# Patient Record
Sex: Male | Born: 1980 | Race: Black or African American | Hispanic: No | Marital: Married | Smoking: Current every day smoker
Health system: Southern US, Community
[De-identification: ages and names within clinical notes are randomized; demographics above are authoritative.]

## PROBLEM LIST (undated history)

## (undated) DIAGNOSIS — K219 Gastro-esophageal reflux disease without esophagitis: Secondary | ICD-10-CM

## (undated) HISTORY — PX: BACK SURGERY: SHX140

---

## 2014-10-01 ENCOUNTER — Other Ambulatory Visit: Payer: Self-pay | Admitting: Neurosurgery

## 2014-10-09 ENCOUNTER — Encounter (HOSPITAL_COMMUNITY)
Admission: RE | Admit: 2014-10-09 | Discharge: 2014-10-09 | Disposition: A | Discharge status: Home / Self Care | Payer: Worker's Compensation | Source: Ambulatory Visit | Attending: Neurosurgery | Admitting: Neurosurgery

## 2014-10-09 ENCOUNTER — Encounter (HOSPITAL_COMMUNITY): Payer: Self-pay

## 2014-10-09 DIAGNOSIS — Z01812 Encounter for preprocedural laboratory examination: Secondary | ICD-10-CM | POA: Diagnosis not present

## 2014-10-09 DIAGNOSIS — M5126 Other intervertebral disc displacement, lumbar region: Secondary | ICD-10-CM | POA: Insufficient documentation

## 2014-10-09 DIAGNOSIS — Z0183 Encounter for blood typing: Secondary | ICD-10-CM | POA: Insufficient documentation

## 2014-10-09 HISTORY — DX: Gastro-esophageal reflux disease without esophagitis: K21.9

## 2014-10-09 LAB — CBC
HEMATOCRIT: 46.1 % (ref 39.0–52.0)
Hemoglobin: 15.8 g/dL (ref 13.0–17.0)
MCH: 32 pg (ref 26.0–34.0)
MCHC: 34.3 g/dL (ref 30.0–36.0)
MCV: 93.3 fL (ref 78.0–100.0)
PLATELETS: 343 10*3/uL (ref 150–400)
RBC: 4.94 MIL/uL (ref 4.22–5.81)
RDW: 14 % (ref 11.5–15.5)
WBC: 7.6 10*3/uL (ref 4.0–10.5)

## 2014-10-09 LAB — SURGICAL PCR SCREEN
MRSA, PCR: NEGATIVE
Staphylococcus aureus: NEGATIVE

## 2014-10-09 LAB — TYPE AND SCREEN
ABO/RH(D): B POS
Antibody Screen: NEGATIVE

## 2014-10-09 LAB — ABO/RH: ABO/RH(D): B POS

## 2014-10-12 MED ORDER — DEXAMETHASONE SODIUM PHOSPHATE 10 MG/ML IJ SOLN
10.0000 mg | INTRAMUSCULAR | Status: AC
Start: 2014-10-13 — End: 2014-10-13
  Administered 2014-10-13: 10 mg via INTRAVENOUS
  Filled 2014-10-12: qty 1

## 2014-10-13 ENCOUNTER — Inpatient Hospital Stay (HOSPITAL_COMMUNITY): Payer: Worker's Compensation | Admitting: Anesthesiology

## 2014-10-13 ENCOUNTER — Encounter (HOSPITAL_COMMUNITY)
Admission: RE | Disposition: A | Discharge status: Home / Self Care | Payer: Self-pay | Source: Ambulatory Visit | Attending: Neurosurgery

## 2014-10-13 ENCOUNTER — Inpatient Hospital Stay (HOSPITAL_COMMUNITY): Payer: Worker's Compensation

## 2014-10-13 ENCOUNTER — Inpatient Hospital Stay (HOSPITAL_COMMUNITY)
Admission: RE | Admit: 2014-10-13 | Discharge: 2014-10-14 | DRG: 460 | Disposition: A | Discharge status: Home / Self Care | Payer: Worker's Compensation | Source: Ambulatory Visit | Attending: Neurosurgery | Admitting: Neurosurgery

## 2014-10-13 DIAGNOSIS — M5126 Other intervertebral disc displacement, lumbar region: Secondary | ICD-10-CM | POA: Diagnosis present

## 2014-10-13 DIAGNOSIS — M549 Dorsalgia, unspecified: Secondary | ICD-10-CM | POA: Diagnosis present

## 2014-10-13 DIAGNOSIS — Z79899 Other long term (current) drug therapy: Secondary | ICD-10-CM

## 2014-10-13 DIAGNOSIS — F17210 Nicotine dependence, cigarettes, uncomplicated: Secondary | ICD-10-CM | POA: Diagnosis present

## 2014-10-13 DIAGNOSIS — M4326 Fusion of spine, lumbar region: Secondary | ICD-10-CM

## 2014-10-13 DIAGNOSIS — M5136 Other intervertebral disc degeneration, lumbar region: Secondary | ICD-10-CM | POA: Diagnosis present

## 2014-10-13 DIAGNOSIS — M51369 Other intervertebral disc degeneration, lumbar region without mention of lumbar back pain or lower extremity pain: Secondary | ICD-10-CM | POA: Diagnosis present

## 2014-10-13 HISTORY — PX: MAXIMUM ACCESS (MAS)POSTERIOR LUMBAR INTERBODY FUSION (PLIF) 1 LEVEL: SHX6368

## 2014-10-13 SURGERY — FOR MAXIMUM ACCESS (MAS) POSTERIOR LUMBAR INTERBODY FUSION (PLIF) 1 LEVEL
Anesthesia: General | Site: Back

## 2014-10-13 MED ORDER — VANCOMYCIN HCL 1000 MG IV SOLR
INTRAVENOUS | Status: AC
  Filled 2014-10-13: qty 1000

## 2014-10-13 MED ORDER — HYDROMORPHONE HCL 1 MG/ML IJ SOLN: 0.2500 mg | INTRAMUSCULAR | Status: DC | PRN

## 2014-10-13 MED ORDER — ACETAMINOPHEN 325 MG PO TABS: 650.0000 mg | ORAL_TABLET | ORAL | Status: DC | PRN

## 2014-10-13 MED ORDER — CEFAZOLIN SODIUM-DEXTROSE 2-3 GM-% IV SOLR
2.0000 g | INTRAVENOUS | Status: AC
  Administered 2014-10-13: 2 g via INTRAVENOUS

## 2014-10-13 MED ORDER — ARTIFICIAL TEARS OP OINT
TOPICAL_OINTMENT | OPHTHALMIC | Status: DC | PRN
Start: 2014-10-13 — End: 2014-10-13
  Administered 2014-10-13: 1 via OPHTHALMIC

## 2014-10-13 MED ORDER — KETOROLAC TROMETHAMINE 30 MG/ML IJ SOLN
INTRAMUSCULAR | Status: AC
  Administered 2014-10-13: 30 mg
  Filled 2014-10-13: qty 1

## 2014-10-13 MED ORDER — LIDOCAINE HCL 4 % MT SOLN
OROMUCOSAL | Status: DC | PRN
  Administered 2014-10-13: 4 mL via TOPICAL

## 2014-10-13 MED ORDER — PROPOFOL 10 MG/ML IV BOLUS
INTRAVENOUS | Status: DC | PRN
  Administered 2014-10-13: 30 mg via INTRAVENOUS
  Administered 2014-10-13 (×2): 50 mg via INTRAVENOUS
  Administered 2014-10-13: 200 mg via INTRAVENOUS

## 2014-10-13 MED ORDER — BUPIVACAINE HCL (PF) 0.25 % IJ SOLN
INTRAMUSCULAR | Status: DC | PRN
  Administered 2014-10-13: 20 mL

## 2014-10-13 MED ORDER — EPHEDRINE SULFATE 50 MG/ML IJ SOLN
INTRAMUSCULAR | Status: AC
  Filled 2014-10-13: qty 1

## 2014-10-13 MED ORDER — PROPOFOL INFUSION 10 MG/ML OPTIME
INTRAVENOUS | Status: DC | PRN
  Administered 2014-10-13: 25 ug/kg/min via INTRAVENOUS

## 2014-10-13 MED ORDER — VANCOMYCIN HCL 1000 MG IV SOLR
INTRAVENOUS | Status: DC | PRN
  Administered 2014-10-13: 1000 mg

## 2014-10-13 MED ORDER — ACETAMINOPHEN 650 MG RE SUPP: 650.0000 mg | RECTAL | Status: DC | PRN

## 2014-10-13 MED ORDER — PROPOFOL 10 MG/ML IV BOLUS
INTRAVENOUS | Status: AC
  Filled 2014-10-13: qty 20

## 2014-10-13 MED ORDER — CEFAZOLIN SODIUM-DEXTROSE 2-3 GM-% IV SOLR
INTRAVENOUS | Status: AC
  Filled 2014-10-13: qty 50

## 2014-10-13 MED ORDER — LIDOCAINE HCL (CARDIAC) 20 MG/ML IV SOLN
INTRAVENOUS | Status: AC
  Filled 2014-10-13: qty 5

## 2014-10-13 MED ORDER — SUCCINYLCHOLINE CHLORIDE 20 MG/ML IJ SOLN
INTRAMUSCULAR | Status: DC | PRN
  Administered 2014-10-13: 120 mg via INTRAVENOUS

## 2014-10-13 MED ORDER — PROMETHAZINE HCL 25 MG/ML IJ SOLN: 6.2500 mg | INTRAMUSCULAR | Status: DC | PRN

## 2014-10-13 MED ORDER — NEOSTIGMINE METHYLSULFATE 10 MG/10ML IV SOLN: INTRAVENOUS | Status: DC | PRN

## 2014-10-13 MED ORDER — SODIUM CHLORIDE 0.9 % IJ SOLN: 3.0000 mL | INTRAMUSCULAR | Status: DC | PRN

## 2014-10-13 MED ORDER — MENTHOL 3 MG MT LOZG: 1.0000 | LOZENGE | OROMUCOSAL | Status: DC | PRN

## 2014-10-13 MED ORDER — SODIUM CHLORIDE 0.9 % IR SOLN
Status: DC | PRN
  Administered 2014-10-13: 500 mL

## 2014-10-13 MED ORDER — ARTIFICIAL TEARS OP OINT
TOPICAL_OINTMENT | OPHTHALMIC | Status: AC
  Filled 2014-10-13: qty 3.5

## 2014-10-13 MED ORDER — FENTANYL CITRATE (PF) 100 MCG/2ML IJ SOLN
INTRAMUSCULAR | Status: DC | PRN
  Administered 2014-10-13 (×3): 100 ug via INTRAVENOUS
  Administered 2014-10-13: 50 ug via INTRAVENOUS

## 2014-10-13 MED ORDER — MIDAZOLAM HCL 2 MG/2ML IJ SOLN
INTRAMUSCULAR | Status: AC
  Filled 2014-10-13: qty 2

## 2014-10-13 MED ORDER — SUCCINYLCHOLINE CHLORIDE 20 MG/ML IJ SOLN
INTRAMUSCULAR | Status: AC
  Filled 2014-10-13: qty 1

## 2014-10-13 MED ORDER — ONDANSETRON HCL 4 MG/2ML IJ SOLN: 4.0000 mg | INTRAMUSCULAR | Status: DC | PRN

## 2014-10-13 MED ORDER — ONDANSETRON HCL 4 MG/2ML IJ SOLN
INTRAMUSCULAR | Status: DC | PRN
  Administered 2014-10-13: 4 mg via INTRAVENOUS

## 2014-10-13 MED ORDER — DIAZEPAM 5 MG PO TABS
5.0000 mg | ORAL_TABLET | Freq: Four times a day (QID) | ORAL | Status: DC | PRN
  Administered 2014-10-13 – 2014-10-14 (×2): 5 mg via ORAL
  Filled 2014-10-13 (×2): qty 1

## 2014-10-13 MED ORDER — ONDANSETRON HCL 4 MG/2ML IJ SOLN
INTRAMUSCULAR | Status: AC
  Filled 2014-10-13: qty 2

## 2014-10-13 MED ORDER — THROMBIN 20000 UNITS EX SOLR
CUTANEOUS | Status: DC | PRN
  Administered 2014-10-13: 20 mL via TOPICAL

## 2014-10-13 MED ORDER — LACTATED RINGERS IV SOLN
INTRAVENOUS | Status: DC
  Administered 2014-10-13: 50 mL/h via INTRAVENOUS
  Administered 2014-10-13 (×2): via INTRAVENOUS

## 2014-10-13 MED ORDER — HYDROCODONE-ACETAMINOPHEN 5-325 MG PO TABS: 1.0000 | ORAL_TABLET | ORAL | Status: DC | PRN

## 2014-10-13 MED ORDER — PHENOL 1.4 % MT LIQD: 1.0000 | OROMUCOSAL | Status: DC | PRN

## 2014-10-13 MED ORDER — OXYCODONE-ACETAMINOPHEN 5-325 MG PO TABS
1.0000 | ORAL_TABLET | ORAL | Status: DC | PRN
  Administered 2014-10-13 – 2014-10-14 (×4): 2 via ORAL
  Filled 2014-10-13 (×4): qty 2

## 2014-10-13 MED ORDER — FENTANYL CITRATE (PF) 250 MCG/5ML IJ SOLN
INTRAMUSCULAR | Status: AC
  Filled 2014-10-13: qty 5

## 2014-10-13 MED ORDER — LIDOCAINE HCL (CARDIAC) 20 MG/ML IV SOLN
INTRAVENOUS | Status: DC | PRN
  Administered 2014-10-13: 100 mg via INTRAVENOUS

## 2014-10-13 MED ORDER — PREGABALIN 25 MG PO CAPS
75.0000 mg | ORAL_CAPSULE | Freq: Two times a day (BID) | ORAL | Status: DC
  Administered 2014-10-13 – 2014-10-14 (×2): 75 mg via ORAL
  Filled 2014-10-13 (×4): qty 1

## 2014-10-13 MED ORDER — 0.9 % SODIUM CHLORIDE (POUR BTL) OPTIME
TOPICAL | Status: DC | PRN
  Administered 2014-10-13: 1000 mL

## 2014-10-13 MED ORDER — MIDAZOLAM HCL 5 MG/5ML IJ SOLN
INTRAMUSCULAR | Status: DC | PRN
  Administered 2014-10-13: 2 mg via INTRAVENOUS

## 2014-10-13 MED ORDER — CEFAZOLIN SODIUM 1-5 GM-% IV SOLN
1.0000 g | Freq: Three times a day (TID) | INTRAVENOUS | Status: AC
  Administered 2014-10-13 – 2014-10-14 (×2): 1 g via INTRAVENOUS
  Filled 2014-10-13 (×2): qty 50

## 2014-10-13 MED ORDER — HYDROMORPHONE HCL 1 MG/ML IJ SOLN: 0.5000 mg | INTRAMUSCULAR | Status: DC | PRN

## 2014-10-13 MED ORDER — HYDROMORPHONE HCL 1 MG/ML IJ SOLN
INTRAMUSCULAR | Status: AC
  Filled 2014-10-13: qty 1

## 2014-10-13 MED ORDER — SODIUM CHLORIDE 0.9 % IJ SOLN
3.0000 mL | Freq: Two times a day (BID) | INTRAMUSCULAR | Status: DC
  Administered 2014-10-14: 3 mL via INTRAVENOUS

## 2014-10-13 SURGICAL SUPPLY — 71 items
BAG DECANTER FOR FLEXI CONT (MISCELLANEOUS) ×2 IMPLANT
BENZOIN TINCTURE PRP APPL 2/3 (GAUZE/BANDAGES/DRESSINGS) ×2 IMPLANT
BLADE CLIPPER SURG (BLADE) IMPLANT
BRUSH SCRUB EZ PLAIN DRY (MISCELLANEOUS) ×2 IMPLANT
BUR CUTTER 7.0 ROUND (BURR) ×2 IMPLANT
BUR MATCHSTICK NEURO 3.0 LAGG (BURR) ×2 IMPLANT
CAGE COROENT 11X9X23-8 (Cage) ×4 IMPLANT
CLIP NEUROVISION LG (CLIP) ×2 IMPLANT
CONT SPEC 4OZ CLIKSEAL STRL BL (MISCELLANEOUS) ×4 IMPLANT
COVER BACK TABLE 24X17X13 BIG (DRAPES) IMPLANT
COVER BACK TABLE 60X90IN (DRAPES) ×2 IMPLANT
DRAPE C-ARM 42X72 X-RAY (DRAPES) ×2 IMPLANT
DRAPE C-ARMOR (DRAPES) ×2 IMPLANT
DRAPE LAPAROTOMY 100X72X124 (DRAPES) ×2 IMPLANT
DRAPE POUCH INSTRU U-SHP 10X18 (DRAPES) ×2 IMPLANT
DRAPE SURG 17X23 STRL (DRAPES) ×8 IMPLANT
DRSG OPSITE POSTOP 4X6 (GAUZE/BANDAGES/DRESSINGS) ×2 IMPLANT
DURAPREP 26ML APPLICATOR (WOUND CARE) ×2 IMPLANT
ELECT BLADE 4.0 EZ CLEAN MEGAD (MISCELLANEOUS)
ELECT REM PT RETURN 9FT ADLT (ELECTROSURGICAL) ×2
ELECTRODE BLDE 4.0 EZ CLN MEGD (MISCELLANEOUS) IMPLANT
ELECTRODE REM PT RTRN 9FT ADLT (ELECTROSURGICAL) ×1 IMPLANT
EVACUATOR 1/8 PVC DRAIN (DRAIN) IMPLANT
GAUZE SPONGE 4X4 12PLY STRL (GAUZE/BANDAGES/DRESSINGS) ×2 IMPLANT
GAUZE SPONGE 4X4 16PLY XRAY LF (GAUZE/BANDAGES/DRESSINGS) IMPLANT
GLOVE BIOGEL PI IND STRL 7.0 (GLOVE) ×1 IMPLANT
GLOVE BIOGEL PI IND STRL 8.5 (GLOVE) ×1 IMPLANT
GLOVE BIOGEL PI INDICATOR 7.0 (GLOVE) ×1
GLOVE BIOGEL PI INDICATOR 8.5 (GLOVE) ×1
GLOVE ECLIPSE 8.5 STRL (GLOVE) ×2 IMPLANT
GLOVE ECLIPSE 9.0 STRL (GLOVE) ×4 IMPLANT
GLOVE EXAM NITRILE LRG STRL (GLOVE) IMPLANT
GLOVE EXAM NITRILE MD LF STRL (GLOVE) IMPLANT
GLOVE EXAM NITRILE XL STR (GLOVE) IMPLANT
GLOVE EXAM NITRILE XS STR PU (GLOVE) IMPLANT
GLOVE SS N UNI LF 7.0 STRL (GLOVE) ×6 IMPLANT
GOWN STRL REUS W/ TWL LRG LVL3 (GOWN DISPOSABLE) ×1 IMPLANT
GOWN STRL REUS W/ TWL XL LVL3 (GOWN DISPOSABLE) ×2 IMPLANT
GOWN STRL REUS W/TWL 2XL LVL3 (GOWN DISPOSABLE) ×2 IMPLANT
GOWN STRL REUS W/TWL LRG LVL3 (GOWN DISPOSABLE) ×1
GOWN STRL REUS W/TWL XL LVL3 (GOWN DISPOSABLE) ×2
KIT BASIN OR (CUSTOM PROCEDURE TRAY) ×2 IMPLANT
KIT NEEDLE NVM5 EMG ELECT (KITS) ×1 IMPLANT
KIT NEEDLE NVM5 EMG ELECTRODE (KITS) ×1
KIT ROOM TURNOVER OR (KITS) ×2 IMPLANT
LIQUID BAND (GAUZE/BANDAGES/DRESSINGS) ×2 IMPLANT
NEEDLE HYPO 22GX1.5 SAFETY (NEEDLE) ×2 IMPLANT
NS IRRIG 1000ML POUR BTL (IV SOLUTION) ×2 IMPLANT
PACK LAMINECTOMY NEURO (CUSTOM PROCEDURE TRAY) ×2 IMPLANT
PUTTY BONE DBX 5CC MIX (Putty) ×2 IMPLANT
ROD 5.5X40MM (Rod) ×4 IMPLANT
SCREW LOCK (Screw) ×4 IMPLANT
SCREW LOCK FXNS SPNE MAS PL (Screw) ×4 IMPLANT
SCREW PLIF MAS 5.0X25MM (Screw) ×4 IMPLANT
SCREW SHANK 5.0X30MM (Screw) ×4 IMPLANT
SCREW TULIP 5.5 (Screw) ×4 IMPLANT
SPONGE LAP 4X18 X RAY DECT (DISPOSABLE) IMPLANT
SPONGE SURGIFOAM ABS GEL 100 (HEMOSTASIS) ×2 IMPLANT
SPONGE SURGIFOAM ABS GEL SZ50 (HEMOSTASIS) IMPLANT
STRIP CLOSURE SKIN 1/2X4 (GAUZE/BANDAGES/DRESSINGS) ×2 IMPLANT
SUT VIC AB 0 CT1 18XCR BRD8 (SUTURE) ×2 IMPLANT
SUT VIC AB 0 CT1 8-18 (SUTURE) ×2
SUT VIC AB 2-0 CT1 18 (SUTURE) ×2 IMPLANT
SUT VIC AB 3-0 SH 8-18 (SUTURE) ×2 IMPLANT
SYR 20ML ECCENTRIC (SYRINGE) ×2 IMPLANT
TAPE STRIPS DRAPE STRL (GAUZE/BANDAGES/DRESSINGS) ×2 IMPLANT
TOWEL OR 17X24 6PK STRL BLUE (TOWEL DISPOSABLE) ×2 IMPLANT
TOWEL OR 17X26 10 PK STRL BLUE (TOWEL DISPOSABLE) ×2 IMPLANT
TRAP SPECIMEN MUCOUS 40CC (MISCELLANEOUS) ×2 IMPLANT
TRAY FOLEY CATH 14FRSI W/METER (CATHETERS) IMPLANT
WATER STERILE IRR 1000ML POUR (IV SOLUTION) ×2 IMPLANT

## 2014-10-13 NOTE — Anesthesia Preprocedure Evaluation (Signed)
Anesthesia Evaluation  Patient identified by MRN, date of birth, ID band Patient awake    Reviewed: Allergy & Precautions, NPO status , Patient's Chart, lab work & pertinent test results  Airway Mallampati: II  TM Distance: >3 FB Neck ROM: Full    Dental no notable dental hx.    Pulmonary Current Smoker,  breath sounds clear to auscultation  Pulmonary exam normal       Cardiovascular negative cardio ROS Normal cardiovascular examRhythm:Regular Rate:Normal     Neuro/Psych negative neurological ROS  negative psych ROS   GI/Hepatic negative GI ROS, Neg liver ROS,   Endo/Other  negative endocrine ROS  Renal/GU negative Renal ROS  negative genitourinary   Musculoskeletal negative musculoskeletal ROS (+)   Abdominal   Peds negative pediatric ROS (+)  Hematology negative hematology ROS (+)   Anesthesia Other Findings   Reproductive/Obstetrics negative OB ROS                             Anesthesia Physical Anesthesia Plan  ASA: II  Anesthesia Plan: General   Post-op Pain Management:    Induction: Intravenous  Airway Management Planned: Oral ETT  Additional Equipment:   Intra-op Plan:   Post-operative Plan: Extubation in OR  Informed Consent: I have reviewed the patients History and Physical, chart, labs and discussed the procedure including the risks, benefits and alternatives for the proposed anesthesia with the patient or authorized representative who has indicated his/her understanding and acceptance.   Dental advisory given  Plan Discussed with: CRNA and Surgeon  Anesthesia Plan Comments:         Anesthesia Quick Evaluation  

## 2014-10-13 NOTE — Plan of Care (Signed)
Problem: Consults Goal: Diagnosis - Spinal Surgery Outcome: Completed/Met Date Met:  10/13/14 Thoraco/Lumbar Spine Fusion

## 2014-10-13 NOTE — Brief Op Note (Signed)
10/13/2014  2:15 PM  PATIENT:  Phillip KraftJerrick D Sheckler  34 y.o. male  PRE-OPERATIVE DIAGNOSIS:  HNP  POST-OPERATIVE DIAGNOSIS:  herniated nucleus pulposus  PROCEDURE:  Procedure(s) with comments: FOR MAXIMUM ACCESS (MAS) POSTERIOR LUMBAR INTERBODY FUSION (PLIF) 1 LEVEL (N/A) - FOR MAXIMUM ACCESS (MAS) POSTERIOR LUMBAR INTERBODY FUSION (PLIF) 1 LEVEL L4-5  SURGEON:  Surgeon(s) and Role:    * Julio SicksHenry Theopolis Sloop, MD - Primary    * Barnett AbuHenry Elsner, MD - Assisting  PHYSICIAN ASSISTANT:   ASSISTANTS:    ANESTHESIA:   general  EBL:  Total I/O In: 2000 [I.V.:2000] Out: 270 [Urine:70; Blood:200]  BLOOD ADMINISTERED:none  DRAINS: none   LOCAL MEDICATIONS USED:  MARCAINE     SPECIMEN:  No Specimen  DISPOSITION OF SPECIMEN:  N/A  COUNTS:  YES  TOURNIQUET:  * No tourniquets in log *  DICTATION: .Dragon Dictation  PLAN OF CARE: Admit to inpatient   PATIENT DISPOSITION:  PACU - hemodynamically stable.   Delay start of Pharmacological VTE agent (>24hrs) due to surgical blood loss or risk of bleeding: yes

## 2014-10-13 NOTE — Op Note (Signed)
Date of procedure: 10/13/2014  Date of dictation: Same  Service: Neurosurgery  Preoperative diagnosis: L4-5 degenerative disc disease with stenosis  Postoperative diagnosis: Same  Procedure Name: L4-5 bilateral redo laminotomies with foraminotomies of the L4 and L5 nerve roots, more than would be required for simple interbody fusion alone.  L4-5 posterior lumbar interbody fusion utilizing interbody peek cages and local autograft.  L4-5 posterior lateral arthrodesis utilizing nonsegmental cortical pedicle screw fixation  Surgeon:Pinkney Venard A.Gerrit Rafalski, M.D.  Asst. Surgeon: Danielle DessElsner  Anesthesia: General  Indication: 34 year old black male status post previous right L4-L5 laminotomy and microdiscectomy for treatment of back pain with radiculopathy. Patient's symptoms improved somewhat postop but is still been debilitating. Patient with marked disc space collapse with anterior angulation of L4 and L5. Patient is failed conservative management. Patient presents now for operative decompression and fusion.  Operative note: After induction anesthesia, patient position prone onto Wilson frame and a properly padded. Lumbar region prepped and draped sterilely. Incision made overlying L4-5. Dissection performed bilaterally. Retractor placed. Fluoroscopy used. Levels confirmed. Injury sites in the pars interarticularis of L4 identified bilaterally. Pilot holes drilled and a cephalad and lateral trajectory under fluoroscopic guidance in both the AP and lateral planes. This was performed under intraoperative neural monitoring. Pilot holes placed without difficulty with good appearance on imaging. Each pilot hole was probed and found to be solidly within the bone. Each pilot hole was then tapped with a screw tap. Each tap hole was probed and found to be solidly within the bone. 5.5 x 30 mm cortical pedicle screws were placed bilaterally at L4. Decompressive laminotomies then performed bilaterally using high-speed drill  and Kerrison rongeurs. Ligament flavum and epidural scar were then elevated and resected so fashion. Wide compressive foraminotomies performed on the course exiting L4 and L5 nerve roots bilaterally. Bilateral discectomies performed at L4-5. Disc space distracted up to 11 mm. Distractor left and patient's left side. Thecal sac and nerve roots protected on the left side. Disc space reamed and cleaned with various curettes and pituitary rongeurs. All soft tissue removed from the interspace. An 11 mm x 8 by 23 mm new invasive cage was placed on the right side and rotated into final position. Distractor removed from patient's left side. Thecal sac and nerve respect on the left side. Disc space once again prepared for interbody fusion. Morselized autograft packed in the interspace. A second cage impacted into place and rotated into final position. Cortical pedicle screws were placed and L5 under fluoroscopic guidance and using intraoperative neural monitoring and a similar fashion as to L4. Short segment titanium rod placed over the screw heads at L4 and L5. Locking caps and placed over the screw. Locking caps and engaged with the construct under compression. Final images revealed good position bone graft and hardware at proper upper level with normal lamina spine. Wounds and irrigated one final time. Vancomycin powder was left in the deep wound space. Wounds and closed in layers with Vicryl sutures. Steri-Strips and sterile dressing were applied. No apparent complications. Patient tolerated the procedure well and he returns to the recovery room postop.

## 2014-10-13 NOTE — Anesthesia Procedure Notes (Signed)
Procedure Name: Intubation Date/Time: 10/13/2014 11:57 AM Performed by: Fransisca KaufmannMEYER, Kashmir Leedy E Pre-anesthesia Checklist: Patient identified, Emergency Drugs available, Suction available, Patient being monitored and Timeout performed Patient Re-evaluated:Patient Re-evaluated prior to inductionOxygen Delivery Method: Circle system utilized Preoxygenation: Pre-oxygenation with 100% oxygen Intubation Type: IV induction Ventilation: Mask ventilation without difficulty Laryngoscope Size: Miller and 3 Grade View: Grade II Tube type: Oral Tube size: 8.0 mm Number of attempts: 1 Airway Equipment and Method: Stylet Placement Confirmation: ETT inserted through vocal cords under direct vision,  positive ETCO2 and breath sounds checked- equal and bilateral Secured at: 23 cm Tube secured with: Tape Dental Injury: Teeth and Oropharynx as per pre-operative assessment

## 2014-10-13 NOTE — Anesthesia Postprocedure Evaluation (Signed)
  Anesthesia Post-op Note  Patient: Phillip KraftJerrick D Petronio  Procedure(s) Performed: Procedure(s) (LRB): FOR MAXIMUM ACCESS (MAS) POSTERIOR LUMBAR INTERBODY FUSION (PLIF) 1 LEVEL (N/A)  Patient Location: PACU  Anesthesia Type: General  Level of Consciousness: awake and alert   Airway and Oxygen Therapy: Patient Spontanous Breathing  Post-op Pain: mild  Post-op Assessment: Post-op Vital signs reviewed, Patient's Cardiovascular Status Stable, Respiratory Function Stable, Patent Airway and No signs of Nausea or vomiting  Last Vitals:  Filed Vitals:   10/13/14 1450  BP: 121/77  Pulse: 111  Temp: 37 C  Resp: 27    Post-op Vital Signs: stable   Complications: No apparent anesthesia complications

## 2014-10-13 NOTE — Transfer of Care (Signed)
Immediate Anesthesia Transfer of Care Note  Patient: Anda KraftJerrick D Hemric  Procedure(s) Performed: Procedure(s) with comments: FOR MAXIMUM ACCESS (MAS) POSTERIOR LUMBAR INTERBODY FUSION (PLIF) 1 LEVEL (N/A) - FOR MAXIMUM ACCESS (MAS) POSTERIOR LUMBAR INTERBODY FUSION (PLIF) 1 LEVEL L4-5  Patient Location: PACU  Anesthesia Type:General  Level of Consciousness: awake, alert , oriented and sedated  Airway & Oxygen Therapy: Patient Spontanous Breathing and Patient connected to face mask oxygen  Post-op Assessment: Report given to RN, Post -op Vital signs reviewed and stable and Patient moving all extremities  Post vital signs: Reviewed and stable  Last Vitals:  Filed Vitals:   10/13/14 1450  BP: 121/77  Pulse: 111  Temp: 37 C  Resp: 27    Complications: No apparent anesthesia complications

## 2014-10-13 NOTE — Progress Notes (Signed)
Orthopedic Tech Progress Note Patient Details:  Josep D Reeves Mar 06, 1981 [id number] Anda Kraftatient was a pre-fit by [employer]. Patient ID: Anda KraftJerrick D Politano, male   DOB: Mar 06, 1981, 34 y.o.   MRN: [id number]   Jennye MoccasinHughes, Myrl Lazarus Craig 10/13/2014, 4:08 PM

## 2014-10-13 NOTE — H&P (Signed)
Phillip Mccarthy is an 34 y.o. male.   Chief Complaint: Back pain HPI: 34 year old male with intractable back and right lower extremity pain failing all conservative management. Workup demonstrates evidence of a broad-based disc herniation at L4-5. Patient status post previous lumbar microdiscectomy on the right at L4-5 with incomplete resolution of his symptoms. Patient has failed conservative management. He is in debilitating pain. He presents now for L4-5 decompression infusion in hopes of improving his symptoms.  Past Medical History  Diagnosis Date  . GERD (gastroesophageal reflux disease)     takes otc    Past Surgical History  Procedure Laterality Date  . Back surgery      with Dr. Jordan Mccarthy  2015-Aug    No family history on file. Social History:  reports that he has been smoking Cigarettes.  He has a 7 pack-year smoking history. He does not have any smokeless tobacco history on file. He reports that he drinks alcohol. He reports that he does not use illicit drugs.  Allergies: No Known Allergies  Medications Prior to Admission  Medication Sig Dispense Refill  . cyclobenzaprine (FLEXERIL) 10 MG tablet Take 10 mg by mouth 3 (three) times daily as needed for muscle spasms.   0  . indomethacin (INDOCIN SR) 75 MG CR capsule Take 75 mg by mouth 2 (two) times daily.  0  . LYRICA 75 MG capsule Take 75 mg by mouth 2 (two) times daily.  0  . oxyCODONE-acetaminophen (PERCOCET/ROXICET) 5-325 MG per tablet Take 1-2 tablets by mouth every 6 (six) hours as needed (pain).   0    No results found for this or any previous visit (from the past 48 hour(s)). No results found.  Review of Systems  Constitutional: Negative.   HENT: Negative.   Eyes: Negative.   Respiratory: Negative.   Cardiovascular: Negative.   Gastrointestinal: Negative.   Genitourinary: Negative.   Skin: Negative.   Psychiatric/Behavioral: Negative.     Blood pressure 160/91, pulse 78, temperature 97.5 F (36.4 C),  temperature source Oral, resp. rate 20, weight 100.245 kg (221 lb), SpO2 100 %. Physical Exam  Constitutional: He is oriented to person, place, and time. He appears well-developed and well-nourished. No distress.  HENT:  Head: Normocephalic and atraumatic.  Right Ear: External ear normal.  Left Ear: External ear normal.  Nose: Nose normal.  Mouth/Throat: Oropharynx is clear and moist. No oropharyngeal exudate.  Eyes: Conjunctivae and EOM are normal. Pupils are equal, round, and reactive to light. Right eye exhibits no discharge. Left eye exhibits no discharge.  Neck: Normal range of motion. Neck supple. No tracheal deviation present. No thyromegaly present.  Cardiovascular: Normal rate, regular rhythm and intact distal pulses.  Exam reveals no friction rub.   No murmur heard. Respiratory: Effort normal and breath sounds normal. No respiratory distress. He has no wheezes.  GI: Soft. Bowel sounds are normal. He exhibits no distension. There is no tenderness.  Musculoskeletal: Normal range of motion. He exhibits no edema or tenderness.  Neurological: He is alert and oriented to person, place, and time. He has normal reflexes. No cranial nerve deficit. Coordination normal.  Skin: Skin is warm and dry. No rash noted. He is not diaphoretic. No erythema. No pallor.  Psychiatric: He has a normal mood and affect. His behavior is normal. Judgment and thought content normal.     Assessment/Plan L4-5 herniated mucous pulposis/degenerative disc disease with intractable back and lower extremity pain. Plan L4-5 redo decompression with discectomy followed by posterior lumbar  interbody fusion utilizing interbody peek cages, locally harvested autograft, and supplemented with nonsegmental cortical pedicle screw fixation. Risks and benefits of been explained. Patient wishes to proceed.  Phillip Mccarthy A 10/13/2014, 11:30 AM

## 2014-10-14 ENCOUNTER — Encounter (HOSPITAL_COMMUNITY): Payer: Self-pay | Admitting: Neurosurgery

## 2014-10-14 MED ORDER — ALUM & MAG HYDROXIDE-SIMETH 200-200-20 MG/5ML PO SUSP
30.0000 mL | ORAL | Status: DC | PRN
  Administered 2014-10-14 (×2): 30 mL via ORAL
  Filled 2014-10-14 (×2): qty 30

## 2014-10-14 MED ORDER — OXYCODONE-ACETAMINOPHEN 10-325 MG PO TABS: 1.0000 | ORAL_TABLET | ORAL | Status: AC | PRN

## 2014-10-14 MED ORDER — DIAZEPAM 5 MG PO TABS: 5.0000 mg | ORAL_TABLET | Freq: Four times a day (QID) | ORAL | Status: AC | PRN

## 2014-10-14 NOTE — Discharge Summary (Signed)
Physician Discharge Summary  Patient ID: [name] MRN: [id number] DOB/AGE: [birth year] 34 y.o.  Admit date: 10/13/2014 Discharge date: 10/14/2014  Admission Diagnoses:  Discharge Diagnoses:  Principal Problem:   DDD (degenerative disc disease), lumbar Active Problems:   Degenerative disc disease, lumbar   Discharged Condition: good  Hospital Course: Patient admitted the hospital where he underwent an uncomplicated L4-5 decompression and fusion. Postoperatively he is doing quite well. Back and lower extremity pain much improved. Standing and walking without difficulty. Patient ready for discharge home.  Consults:   Significant Diagnostic Studies:   Treatments:   Discharge Exam: Blood pressure 165/87, pulse 103, temperature 98.7 F (37.1 C), temperature source Axillary, resp. rate 18, weight 100.245 kg (221 lb), SpO2 98 %. Awake and alert. Oriented and appropriate. Motor and sensory function intact. Wound clean and dry. Chest and abdomen benign. Disposition: Final discharge disposition not confirmed     Medication List    STOP taking these medications        indomethacin 75 MG CR capsule  Commonly known as:  INDOCIN SR     oxyCODONE-acetaminophen 5-325 MG per tablet  Commonly known as:  PERCOCET/ROXICET  Replaced by:  oxyCODONE-acetaminophen 10-325 MG per tablet      TAKE these medications        cyclobenzaprine 10 MG tablet  Commonly known as:  FLEXERIL  Take 10 mg by mouth 3 (three) times daily as needed for muscle spasms.     diazepam 5 MG tablet  Commonly known as:  VALIUM  Take 1-2 tablets (5-10 mg total) by mouth every 6 (six) hours as needed for muscle spasms.     LYRICA 75 MG capsule  Generic drug:  pregabalin  Take 75 mg by mouth 2 (two) times daily.     oxyCODONE-acetaminophen 10-325 MG per tablet  Commonly known as:  PERCOCET  Take 1-2 tablets by mouth every 4 (four) hours as needed for pain.           Follow-up Information    Follow  up with Temple PaciniPOOL,Nahla Lukin A, MD.   Specialty:  Neurosurgery   Contact information:   [address] [place] [address] [phone]       Signed: Temple PaciniOOL,Dimples Probus A 10/14/2014, 10:49 AM

## 2014-10-14 NOTE — Progress Notes (Signed)
Discharged instructions given. Pt verbalized understanding and all questions were answered.  

## 2014-10-14 NOTE — Discharge Instructions (Signed)

## 2014-10-14 NOTE — Evaluation (Signed)
Occupational Therapy Evaluation Patient Details Name: SRIMAN TALLY MRN: [id number] DOB: May 15, 1981 Today's Date: 10/14/2014    History of Present Illness 34 year old black male status post previous right L4-L5 laminotomy and microdiscectomy for treatment of back pain with radiculopathy. Patient's symptoms improved somewhat postop but is still been debilitating. Patient with marked disc space collapse with anterior angulation of L4 and L5. Patient is failed conservative management. Pt underwent lami and interbody fusion 10-13-14.   Clinical Impression   Pt is at min A level with LB ADLs and min gard A with ADL mobility/transfers. Pt and his wife provided with education and demonstration of ADL A/E for home use, educated on use of toileting aid, toilet riser and tub bench for home use. Pt's wife will provide assist at home. All education completed and no further acute OT services indicated at this time    Follow Up Recommendations  No OT follow up    Equipment Recommendations  Toilet riser;Tub/shower bench;Other (comment) (ADL A/E , toileting aid)    Recommendations for Other Services       Precautions / Restrictions Precautions Precautions: Back Precaution Booklet Issued: Yes (comment) Precaution Comments: reviewd back precautions with pt and his wife, provided handout Required Braces or Orthoses: Spinal Brace Spinal Brace: Lumbar corset;Applied in sitting position Restrictions Weight Bearing Restrictions: No      Mobility Bed Mobility Overal bed mobility: Needs Assistance Bed Mobility: Supine to Sit;Sit to Supine     Supine to sit: Min guard Sit to supine: Min guard   General bed mobility comments: pt up in recliner upon entering room  Transfers Overall transfer level: Needs assistance Equipment used: None Transfers: Sit to/from Stand Sit to Stand: Min guard Stand pivot transfers: Min guard       General transfer comment: verbal cues for sequencing     Balance Overall balance assessment: No apparent balance deficits (not formally assessed)                                          ADL Overall ADL's : Needs assistance/impaired     Grooming: Wash/dry hands;Wash/dry face;Supervision/safety;Standing   Upper Body Bathing: Supervision/ safety;Set up;Sitting   Lower Body Bathing: Minimal assistance   Upper Body Dressing : Supervision/safety;Set up;Sitting   Lower Body Dressing: Minimal assistance   Toilet Transfer: Min guard;Regular Toilet;Grab bars;Ambulation   Toileting- Clothing Manipulation and Hygiene: Min guard   Tub/ Shower Transfer: Min guard;Grab bars;Shower seat;Ambulation   Functional mobility during ADLs: Min guard General ADL Comments: Pt and his wife provided with education and demo of ADL A/E for home use. Educated on use of toileting aid, toilet riser and tub bench     Vision  reading glasses   Perception Perception Perception Tested?: No   Praxis Praxis Praxis tested?: Not tested    Pertinent Vitals/Pain Pain Assessment: 0-10 Pain Score: 8  Pain Location: back Pain Descriptors / Indicators: Radiating;Throbbing;Pressure Pain Intervention(s): Monitored during session;Repositioned     Hand Dominance Right   Extremity/Trunk Assessment Upper Extremity Assessment Upper Extremity Assessment: Overall WFL for tasks assessed   Lower Extremity Assessment Lower Extremity Assessment: Defer to PT evaluation   Cervical / Trunk Assessment Cervical / Trunk Assessment: Normal   Communication Communication Communication: No difficulties   Cognition Arousal/Alertness: Awake/alert Behavior During Therapy: WFL for tasks assessed/performed Overall Cognitive Status: Within Functional Limits for tasks assessed  General Comments   pt pleasant and cooperative                 Home Living Family/patient expects to be discharged to:: Private residence Living  Arrangements: Spouse/significant other;Children Available Help at Discharge: Family;Available 24 hours/day Type of Home: Mobile home Home Access: Stairs to enter Entrance Stairs-Number of Steps: 7 Entrance Stairs-Rails: Right Home Layout: One level     Bathroom Shower/Tub: Chief Strategy OfficerTub/shower unit   Bathroom Toilet: Standard     Home Equipment: None          Prior Functioning/Environment Level of Independence: Independent             OT Diagnosis: Acute pain   OT Problem List: Pain;Decreased knowledge of use of DME or AE;Decreased activity tolerance   OT Treatment/Interventions:      OT Goals(Current goals can be found in the care plan section) Acute Rehab OT Goals Patient Stated Goal: walk better OT Goal Formulation: With patient/family  OT Frequency:     Barriers to D/C:  none                        End of Session Equipment Utilized During Treatment: Other (comment) (ADL A/E)  Activity Tolerance: Patient tolerated treatment well;No increased pain Patient left: in chair;with call bell/phone within reach;with family/visitor present   Time: 8295-62130946-1012 OT Time Calculation (min): 26 min Charges:  OT General Charges $OT Visit: 1 Procedure OT Evaluation $Initial OT Evaluation Tier I: 1 Procedure OT Treatments $Therapeutic Activity: 8-22 mins G-Codes:    Galen ManilaSpencer, Nashae Maudlin Jeanette 10/14/2014, 1:17 PM

## 2014-10-14 NOTE — Care Management Note (Addendum)
Case Management Note  Patient Details  Name: Phillip Mccarthy MRN: [id number] Date of Birth: 23-Nov-1980  Subjective/Objective:    34 yr old male admitted with degenerative disk disease of his lumber spine. Patient underwent L4-L5 decompression and fusion               Action/Plan:  Patient has no home health needs, will need cane. Patient under worker's comp. Case manager contacted his nurse case manager Ileana Roupmy Pearson, RN @[phone] . Authorization for [facility]ane given to [facility].    Expected Discharge Date: 10/14/14                  Expected Discharge Plan: Home    In-House Referral:  NA  Discharge planning Services  CM Consult  Post Acute Care Choice:  Durable Medical Equipment Choice offered to:  NA  DME Arranged:  [facility] DME Agency:  [facility] NA  [facility]     Status of Service:  Completed, signed off  Medicare Important Message Given:    Date Medicare IM Given:    Medicare IM give by:    Date Additional Medicare IM Given:    Additional Medicare Important Message give by:     If discussed at Long Length of Stay Meetings, dates discussed:    Additional Comments:  Case manager contacted Amy Pearson regarding patient's additonal DME needs. CM will fax orders for  Reacher, long handled shoe horn, long handled sponge, sock aid, RW and 3in1 to [employer] [facility]. DME needs to be delivered to patient's home.    Vance PeperSusan Sly Parlee, RN BSN  10/14/2014, 11:39 AM

## 2014-10-14 NOTE — Evaluation (Signed)
Physical Therapy Evaluation Patient Details Name: Phillip Mccarthy MRN: [id number] DOB: September 15, 1980 Today's Date: 10/14/2014   History of Present Illness  34 year old black male status post previous right L4-L5 laminotomy and microdiscectomy for treatment of back pain with radiculopathy. Patient's symptoms improved somewhat postop but is still been debilitating. Patient with marked disc space collapse with anterior angulation of L4 and L5. Patient is failed conservative management. Pt underwent lami and interbody fusion 10-13-14.  Clinical Impression  Patient is s/p above surgery resulting in the deficits listed below (see PT Problem List). Min guard assist needed on eval for bed mobility, transfers and gait 180 feet. [name], so a cane would be appropriate for home use. Patient will benefit from skilled PT to increase their independence and safety with mobility (while adhering to their precautions) to allow discharge to the venue listed below.     Follow Up Recommendations No PT follow up;Supervision - Intermittent    Equipment Recommendations  Cane    Recommendations for Other Services       Precautions / Restrictions Precautions Precautions: Back Precaution Comments: Educated pt on 3/3 back precautions.  Required Braces or Orthoses: Spinal Brace Spinal Brace: Lumbar corset;Applied in sitting position      Mobility  Bed Mobility Overal bed mobility: Needs Assistance Bed Mobility: Supine to Sit;Sit to Supine     Supine to sit: Min guard Sit to supine: Min guard   General bed mobility comments: min guard for logroll technique  Transfers Overall transfer level: Needs assistance Equipment used: None Transfers: Sit to/from [employer] Sit to Stand: Min guard Stand pivot transfers: Min guard       General transfer comment: verbal cues for sequencing  Ambulation/Gait Ambulation/Gait assistance: Min guard Ambulation Distance (Feet): 180  Feet Assistive device: None Gait Pattern/deviations: Step-through pattern;Decreased stride length Gait velocity: decreased Gait velocity interpretation: Below normal speed for age/gender General Gait Details: [name]  Stairs            Wheelchair Mobility    Modified Rankin (Stroke Patients Only)       Balance Overall balance assessment: No apparent balance deficits (not formally assessed)                                           Pertinent Vitals/Pain Pain Assessment: 0-10 Pain Score: 8  Pain Location: low back and RLE Pain Descriptors / Indicators: Radiating Pain Intervention(s): Monitored during session;Repositioned    Home Living Family/patient expects to be discharged to:: Private residence Living Arrangements: Spouse/significant other;Children Available Help at Discharge: Family;Available 24 hours/day Type of Home: Mobile home Home Access: Stairs to enter Entrance Stairs-Rails: Right Entrance Stairs-Number of Steps: 7 Home Layout: One level Home Equipment: None      Prior Function Level of Independence: Independent               Hand Dominance        Extremity/Trunk Assessment                         Communication   Communication: No difficulties  Cognition Arousal/Alertness: Awake/alert Behavior During Therapy: WFL for tasks assessed/performed Overall Cognitive Status: Within Functional Limits for tasks assessed                      General  Comments      Exercises        Assessment/Plan    PT Assessment Patient needs continued PT services  PT Diagnosis Difficulty walking;Acute pain   PT Problem List Decreased activity tolerance;Decreased balance;Decreased mobility;Decreased knowledge of precautions;Pain;Decreased knowledge of use of DME  PT Treatment Interventions DME instruction;Gait training;Stair training;Functional mobility training;Therapeutic  activities;Patient/family education;Balance training   PT Goals (Current goals can be found in the Care Plan section) Acute Rehab PT Goals Patient Stated Goal: walk better PT Goal Formulation: With patient Time For Goal Achievement: 10/21/14 Potential to Achieve Goals: Good    Frequency Min 5X/week   Barriers to discharge        Co-evaluation               End of Session Equipment Utilized During Treatment: Gait belt;Back brace Activity Tolerance: Patient tolerated treatment well Patient left: in chair;with call bell/phone within reach;with family/visitor present Nurse Communication: Mobility status         Time: [id number] PT Time Calculation (min) (ACUTE ONLY): 18 min   Charges:   PT Evaluation $Initial PT Evaluation Tier I: 1 Procedure     PT G CodesIlda Mccarthy:        Phillip Mccarthy 10/14/2014, 9:46 AM

## 2017-01-18 ENCOUNTER — Encounter (HOSPITAL_COMMUNITY): Payer: Self-pay

## 2017-01-18 ENCOUNTER — Emergency Department (HOSPITAL_COMMUNITY)
Admission: EM | Admit: 2017-01-18 | Discharge: 2017-01-18 | Disposition: A | Discharge status: Home / Self Care | Payer: BLUE CROSS/BLUE SHIELD | Attending: Emergency Medicine | Admitting: Emergency Medicine

## 2017-01-18 ENCOUNTER — Emergency Department (HOSPITAL_COMMUNITY): Payer: BLUE CROSS/BLUE SHIELD

## 2017-01-18 DIAGNOSIS — F17210 Nicotine dependence, cigarettes, uncomplicated: Secondary | ICD-10-CM | POA: Insufficient documentation

## 2017-01-18 DIAGNOSIS — R0789 Other chest pain: Secondary | ICD-10-CM

## 2017-01-18 DIAGNOSIS — Z79899 Other long term (current) drug therapy: Secondary | ICD-10-CM | POA: Diagnosis not present

## 2017-01-18 LAB — CBC
HCT: 40.9 % (ref 39.0–52.0)
HEMOGLOBIN: 13.9 g/dL (ref 13.0–17.0)
MCH: 32 pg (ref 26.0–34.0)
MCHC: 34 g/dL (ref 30.0–36.0)
MCV: 94.2 fL (ref 78.0–100.0)
PLATELETS: 265 10*3/uL (ref 150–400)
RBC: 4.34 MIL/uL (ref 4.22–5.81)
RDW: 13 % (ref 11.5–15.5)
WBC: 6.9 10*3/uL (ref 4.0–10.5)

## 2017-01-18 LAB — BASIC METABOLIC PANEL
ANION GAP: 6 (ref 5–15)
BUN: 14 mg/dL (ref 6–20)
CALCIUM: 9.5 mg/dL (ref 8.9–10.3)
CO2: 25 mmol/L (ref 22–32)
CREATININE: 1.07 mg/dL (ref 0.61–1.24)
Chloride: 106 mmol/L (ref 101–111)
GFR calc Af Amer: >60 mL/min (ref >60)
GLUCOSE: 98 mg/dL (ref 65–99)
Potassium: 4.3 mmol/L (ref 3.5–5.1)
Sodium: 137 mmol/L (ref 135–145)

## 2017-01-18 LAB — I-STAT TROPONIN, ED: TROPONIN I, POC: 0 ng/mL (ref 0.00–0.08)

## 2017-01-18 NOTE — ED Triage Notes (Signed)
Patient complains of intermittent left anterior CP since March. States that the pain has been more frequently and increased in intensity x 1 week. On arrival NAD, alert and oriented

## 2017-01-18 NOTE — ED Notes (Signed)
ED Provider at bedside. 

## 2017-01-18 NOTE — Discharge Instructions (Signed)
You were evaluated in the ER for chest pain that does not appear to be due to a cardiac cause. Please call and establish with a primary care physician for outpatient follow up, contact information for 2 options are below.

## 2017-01-18 NOTE — ED Provider Notes (Signed)
[facility]-EMERGENCY DEPT Provider Note   CSN: [id number] Arrival date & time: 01/18/17  1723     History   Chief Complaint Chief Complaint  Patient presents with  . Chest Pain    HPI YUVAAN OLANDER is a 36 y.o. male.  Patient is a 36 yo M with PMH GERD who presents to the ED with chest pain. 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 Feels like a shooting pain that goes from the upper L part of the chest down to the L costochondral area. Initially was once every few days and now is occurring almost daily. No definitive exacerbating or relieving factors but patient states that may be related to feeling angry. Not associated with diaphoresis, SOB, nausea, positional changes. He states that his biggest concern was making sure this is not cardiac related since he does not have a PCP.      Past Medical History:  Diagnosis Date  . GERD (gastroesophageal reflux disease)    takes otc    Patient Active Problem List   Diagnosis Date Noted  . DDD (degenerative disc disease), lumbar 10/13/2014  . Degenerative disc disease, lumbar 10/13/2014    Past Surgical History:  Procedure Laterality Date  . BACK SURGERY     with Dr. Jordan Likes  2015-Aug  . MAXIMUM ACCESS (MAS)POSTERIOR LUMBAR INTERBODY FUSION (PLIF) 1 LEVEL N/A 10/13/2014   Procedure: FOR MAXIMUM ACCESS (MAS) POSTERIOR LUMBAR INTERBODY FUSION (PLIF) 1 LEVEL;  Surgeon: Julio Sicks, MD;  Location: [facility];  Service: Neurosurgery;  Laterality: N/A;  FOR MAXIMUM ACCESS (MAS) POSTERIOR LUMBAR INTERBODY FUSION (PLIF) 1 LEVEL L4-5       Home Medications    Prior to Admission medications   Medication Sig Start Date End Date Taking? Authorizing Provider  cyclobenzaprine (FLEXERIL) 10 MG tablet Take 10 mg by mouth 3 (three) times daily as needed for muscle spasms.  08/27/14   [provider]  diazepam (VALIUM) 5 MG tablet Take 1-2  tablets (5-10 mg total) by mouth every 6 (six) hours as needed for muscle spasms. 10/14/14   Julio Sicks, MD  LYRICA 75 MG capsule Take 75 mg by mouth 2 (two) times daily. 09/17/14   [provider]  oxyCODONE-acetaminophen (PERCOCET) 10-325 MG per tablet Take 1-2 tablets by mouth every 4 (four) hours as needed for pain. 10/14/14   Julio Sicks, MD    Family History No family history on file.  Social History Social History  Substance Use Topics  . Smoking status: Current Every Day Smoker    Packs/day: 0.50    Years: 14.00    Types: Cigarettes  . Smokeless tobacco: Never Used  . Alcohol use Yes     Comment: rarely to occasionally   1-2 shots of jose querva     Allergies   Patient has no known allergies.   Review of Systems Review of Systems  Constitutional: Negative for chills and fever.  HENT: Negative for congestion.   Respiratory: Negative for chest tightness, shortness of breath and wheezing.   Cardiovascular: Positive for chest pain. Negative for palpitations.  Gastrointestinal: Negative for abdominal pain, constipation, diarrhea, nausea and vomiting.  Genitourinary: Negative for dysuria.  Musculoskeletal: Negative for arthralgias and myalgias.  Skin: Negative for rash.  Neurological: Negative for dizziness, weakness, light-headedness, numbness and headaches.     Physical Exam Updated Vital Signs BP 120/65 (BP Location: Left Arm)   Pulse (!) 56  Temp 98.5 F (36.9 C) (Oral)   Resp 16   Ht 5\' 8"  (1.727 m)   Wt 90.7 kg (200 lb)   SpO2 99%   BMI 30.41 kg/m   Physical Exam  Constitutional: He is oriented to person, place, and time. He appears well-developed and well-nourished. No distress.  HENT:  Head: Normocephalic and atraumatic.  Right Ear: External ear normal.  Left Ear: External ear normal.  Nose: Nose normal.  Mouth/Throat: Oropharynx is clear and moist. No oropharyngeal exudate.  Eyes: Pupils are equal, round, and reactive to light.  Conjunctivae and EOM are normal.  Neck: Normal range of motion. Neck supple.  Cardiovascular: Normal rate, regular rhythm, normal heart sounds and intact distal pulses.   No murmur heard. Pulmonary/Chest: Effort normal and breath sounds normal. No respiratory distress. He has no wheezes. He exhibits no tenderness.  Abdominal: Soft. Bowel sounds are normal. He exhibits no distension. There is no tenderness. There is no rebound and no guarding.  Musculoskeletal: Normal range of motion. He exhibits no edema.  Lymphadenopathy:    He has no cervical adenopathy.  Neurological: He is alert and oriented to person, place, and time. He exhibits normal muscle tone. Coordination normal.  Skin: Skin is warm and dry. Capillary refill takes less than 2 seconds. No rash noted.  Psychiatric: He has a normal mood and affect.     ED Treatments / Results  Labs (all labs ordered are listed, but only abnormal results are displayed) Labs Reviewed  BASIC METABOLIC PANEL  CBC  I-STAT TROPONIN, ED    EKG  EKG Interpretation None       Radiology Dg Chest 2 View  Result Date: 01/18/2017 CLINICAL DATA:  Intermittent left anterior chest pain. EXAM: CHEST  2 VIEW COMPARISON:  None. FINDINGS: Cardiomediastinal silhouette is normal. Mediastinal contours appear intact. There is no evidence of focal airspace consolidation, pleural effusion or pneumothorax. Osseous structures are without acute abnormality. Soft tissues are grossly normal. IMPRESSION: No active cardiopulmonary disease. Electronically Signed   By: Ted Mcalpineobrinka  Dimitrova M.D.   On: 01/18/2017 18:17    Procedures Procedures (including critical care time)  Medications Ordered in ED Medications - No data to display   Initial Impression / Assessment and Plan / ED Course  I have reviewed the triage vital signs and the nursing notes.  Pertinent labs & imaging results that were available during my care of the patient were reviewed by me and considered  in my medical decision making (see chart for details).   Patient is a 36yo M who presented with intermittent chest pain that has been ongoing since March. This current episode has been acutely worse over the last week. He is well appearing on exam with stable vitals.  CXR, Troponin and EKG are negative. Reassured patient and discussed that possibly MSK related or stress related. Offered to have patient follow up with me in outpatient setting and provided contact information. Also provided additional outpatient clinic in case he prefers to go elsewhere.   Final Clinical Impressions(s) / ED Diagnoses   Final diagnoses:  Atypical chest pain    New Prescriptions New Prescriptions   No medications on file     Leland HerYoo, Stephene Alegria J, DO 01/18/17 2253    Gerhard MunchLockwood, Robert, MD 01/24/17 [phone]

## 2018-11-14 IMAGING — DX DG CHEST 2V
2 series · 2 of 2 positions shown · non-contrast
Comparison: None.

CLINICAL DATA: Intermittent left anterior chest pain.

EXAM:
CHEST  2 VIEW

[chest pa]
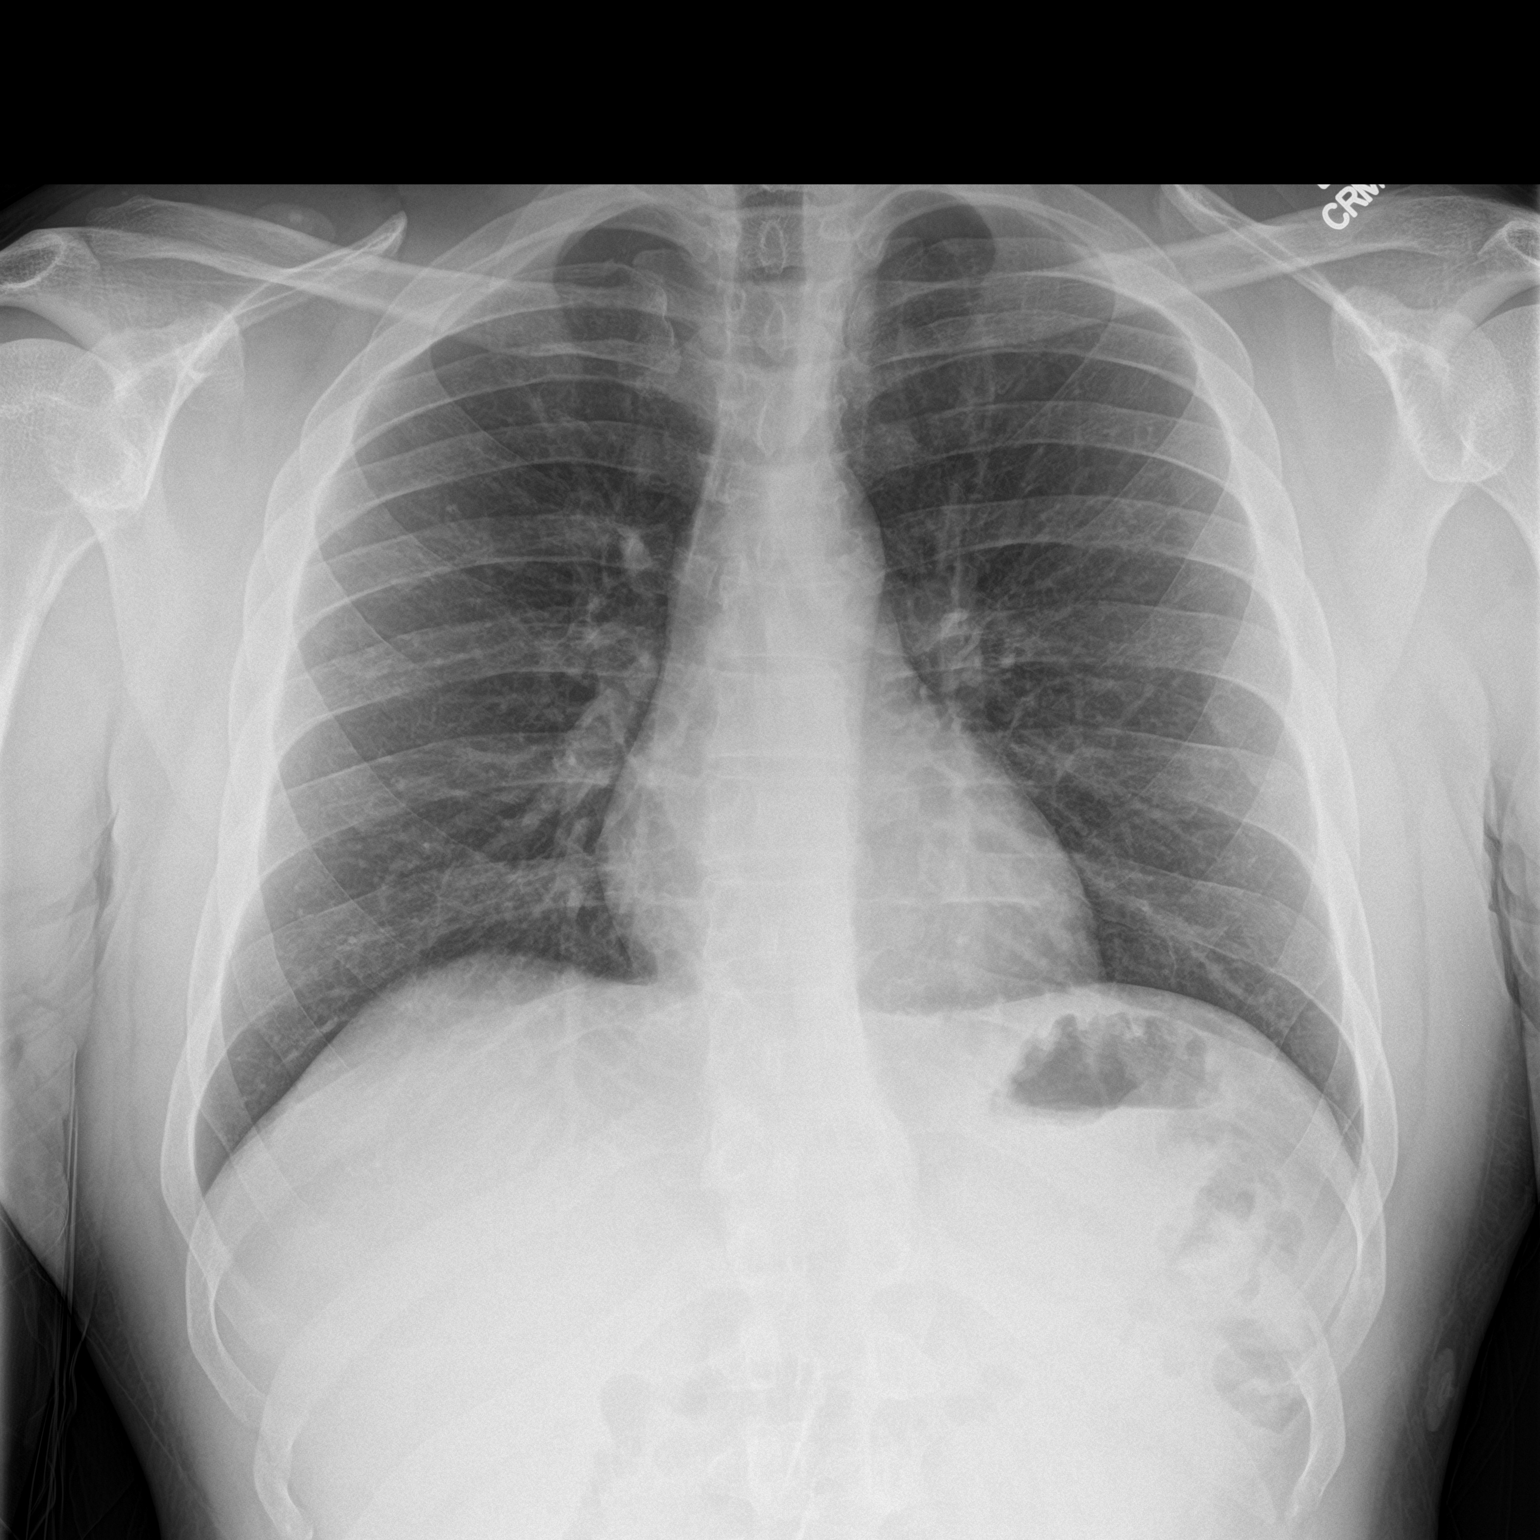

[chest lat]
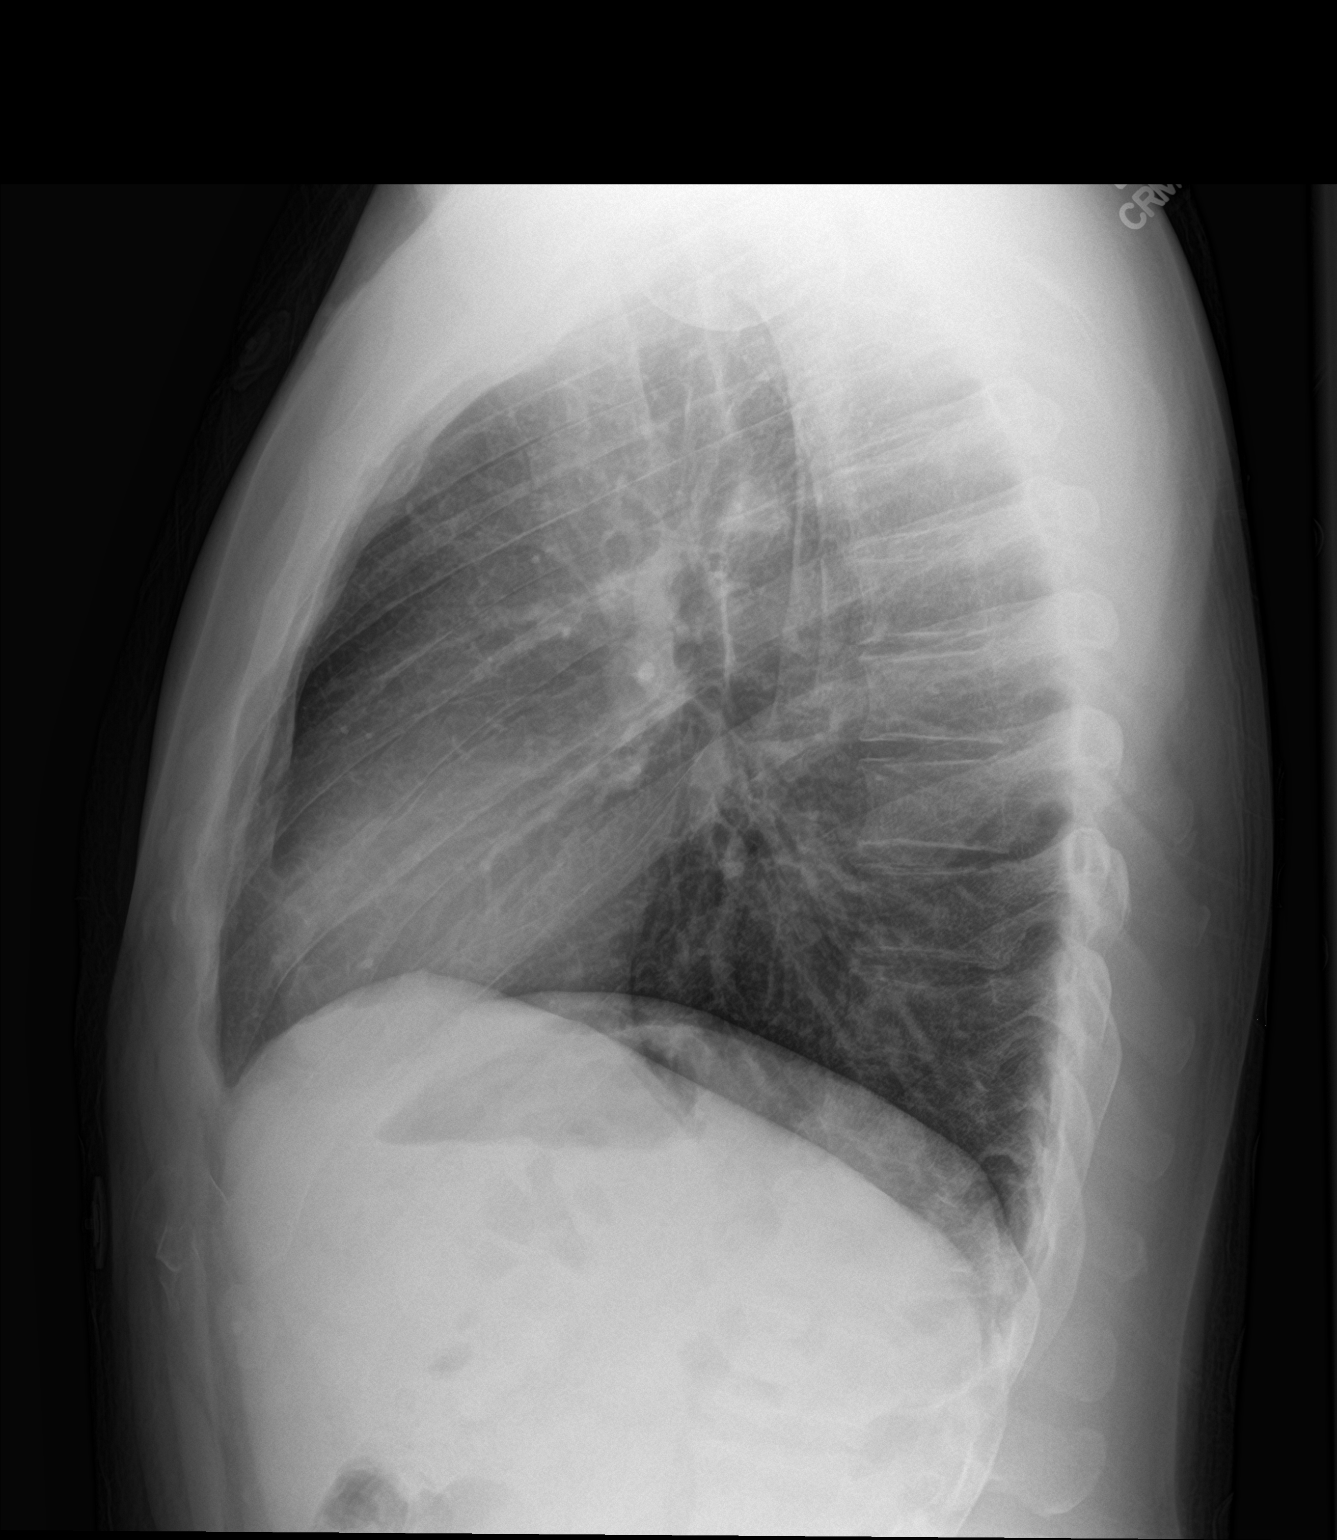

[2 of 2 positions shown; findings below may reference images not displayed]

FINDINGS: Cardiomediastinal silhouette is normal. Mediastinal contours appear
intact.

There is no evidence of focal airspace consolidation, pleural
effusion or pneumothorax.

Osseous structures are without acute abnormality. Soft tissues are
grossly normal.
IMPRESSION: No active cardiopulmonary disease.

## 2019-06-09 ENCOUNTER — Emergency Department
Admission: EM | Admit: 2019-06-09 | Discharge: 2019-06-09 | Disposition: A | Discharge status: Home / Self Care | Payer: BC Managed Care – PPO | Attending: Student in an Organized Health Care Education/Training Program | Admitting: Student in an Organized Health Care Education/Training Program

## 2019-06-09 ENCOUNTER — Encounter: Payer: Self-pay | Admitting: Emergency Medicine

## 2019-06-09 ENCOUNTER — Other Ambulatory Visit: Payer: Self-pay

## 2019-06-09 DIAGNOSIS — F17210 Nicotine dependence, cigarettes, uncomplicated: Secondary | ICD-10-CM | POA: Diagnosis not present

## 2019-06-09 DIAGNOSIS — T50901A Poisoning by unspecified drugs, medicaments and biological substances, accidental (unintentional), initial encounter: Secondary | ICD-10-CM | POA: Insufficient documentation

## 2019-06-09 DIAGNOSIS — R4182 Altered mental status, unspecified: Secondary | ICD-10-CM | POA: Diagnosis present

## 2019-06-09 NOTE — ED Triage Notes (Signed)
Pt via EMS from a traffic stop. Pt was pulled over and during the duration of the traffic stop pt decreased LOC, began having chills, and had pinpoint pupils. Pt admitted to taking ecstasy last night. Pt was given 2 of Narcan. On arrival, pt is A&Ox4 and speaking clear and in complete sentences. Pt is currently in handcuffs accompanied by a Emergency planning/management officer

## 2019-06-09 NOTE — ED Provider Notes (Signed)
[facility] Emergency Department Provider Note    First MD Initiated Contact with Patient 06/09/19 1004     (approximate)  I have reviewed the triage vital signs and the nursing notes.   HISTORY  Chief Complaint Altered Mental Status    HPI Phillip Mccarthy is a 39 y.o. male below listed past medical history presents under police custody after he was being pulled over for DUI.  Did appear drowsy started becoming less responsive pinpoint pupils and was given Narcan.  Patient does admit to using substances last night including ecstasy.  Denies any other ingestion.  Patient quickly after receiving Narcan became more alert and responsive.  He denies any headache.  No numbness or tingling.  No chest pain or shortness of breath.  Denies any fevers.  No intent for self-harm.  This purely recreational.    Past Medical History:  Diagnosis Date  . GERD (gastroesophageal reflux disease)    takes otc   History reviewed. No pertinent family history. Past Surgical History:  Procedure Laterality Date  . BACK SURGERY     with Dr. Jordan Likes  2015-Aug  . MAXIMUM ACCESS (MAS)POSTERIOR LUMBAR INTERBODY FUSION (PLIF) 1 LEVEL N/A 10/13/2014   Procedure: FOR MAXIMUM ACCESS (MAS) POSTERIOR LUMBAR INTERBODY FUSION (PLIF) 1 LEVEL;  Surgeon: Julio Sicks, MD;  Location: [facility];  Service: Neurosurgery;  Laterality: N/A;  FOR MAXIMUM ACCESS (MAS) POSTERIOR LUMBAR INTERBODY FUSION (PLIF) 1 LEVEL L4-5   Patient Active Problem List   Diagnosis Date Noted  . DDD (degenerative disc disease), lumbar 10/13/2014  . Degenerative disc disease, lumbar 10/13/2014      Prior to Admission medications   Medication Sig Start Date End Date Taking? Authorizing Provider  diazepam (VALIUM) 5 MG tablet Take 1-2 tablets (5-10 mg total) by mouth every 6 (six) hours as needed for muscle spasms. Patient not taking: Reported on 01/18/2017 10/14/14   Julio Sicks, MD  oxyCODONE-acetaminophen (PERCOCET)  10-325 MG per tablet Take 1-2 tablets by mouth every 4 (four) hours as needed for pain. Patient not taking: Reported on 01/18/2017 10/14/14   Julio Sicks, MD    Allergies Patient has no known allergies.    Social History Social History   Tobacco Use  . Smoking status: Current Every Day Smoker    Packs/day: 1.00    Years: 14.00    Pack years: 14.00    Types: Cigarettes  . Smokeless tobacco: Never Used  Substance Use Topics  . Alcohol use: Yes    Comment: rarely to occasionally   1-2 shots of jose querva  . Drug use: No    Review of Systems Patient denies headaches, rhinorrhea, blurry vision, numbness, shortness of breath, chest pain, edema, cough, abdominal pain, nausea, vomiting, diarrhea, dysuria, fevers, rashes or hallucinations unless otherwise stated above in HPI. ____________________________________________   PHYSICAL EXAM:  VITAL SIGNS: Vitals:   06/09/19 0943 06/09/19 0952  BP: 112/64   Pulse:  85  Resp: 16   Temp: (!) 97.5 F (36.4 C)   SpO2:  100%    Constitutional: Alert and oriented.  Eyes: Conjunctivae are normal.  Head: Atraumatic. Nose: No congestion/rhinnorhea. Mouth/Throat: Mucous membranes are moist.   Neck: No stridor. Painless ROM.  Cardiovascular: Normal rate, regular rhythm. Grossly normal heart sounds.  Good peripheral circulation. Respiratory: Normal respiratory effort.  No retractions. Lungs CTAB. Gastrointestinal: Soft and nontender. No distention. No abdominal bruits. No CVA tenderness. Genitourinary:  Musculoskeletal: No lower extremity tenderness nor edema.  No joint  effusions. Neurologic:  Normal speech and language. No gross focal neurologic deficits are appreciated. No facial droop Skin:  Skin is warm, dry and intact. No rash noted. Psychiatric: Mood and affect are normal. Speech and behavior are normal.  ____________________________________________   LABS (all labs ordered are listed, but only abnormal results are  displayed)  No results found for this or any previous visit (from the past 24 hour(s)). ____________________________________________  EKG My review and personal interpretation at Time:   10:53 Indication: OD  Rate: 90  Rhythm: sinus Axis: normal Other: normal intervals, no stemi ____________________________________________  RADIOLOGY  I personally reviewed all radiographic images ordered to evaluate for the above acute complaints and reviewed radiology reports and findings.  These findings were personally discussed with the patient.  Please see medical record for radiology report.  ____________________________________________   PROCEDURES  Procedure(s) performed:  Procedures    Critical Care performed: no ____________________________________________   INITIAL IMPRESSION / ASSESSMENT AND PLAN / ED COURSE  Pertinent labs & imaging results that were available during my care of the patient were reviewed by me and considered in my medical decision making (see chart for details).   DDX: overdose, dysrhythmia, polysubstance abuse  Phillip Mccarthy is a 39 y.o. who presents to the ED with symptoms as described above.  He is currently well-appearing in no acute distress.  Neuro exam is nonfocal.  No reported trauma.  EKG without any evidence of preexcitation syndrome or dysrhythmia.  Symptoms improved after Narcan and patient was observed in the ER for 2 hours without any recurrent or rebound symptoms.  Able to ambulate with steady gait.  Purely recreational he states.  No indication for IVC.  He stable and appropriate for discharge     The patient was evaluated in Emergency Department today for the symptoms described in the history of present illness. He/she was evaluated in the context of the global COVID-19 pandemic, which necessitated consideration that the patient might be at risk for infection with the SARS-CoV-2 virus that causes COVID-19. Institutional protocols and algorithms  that pertain to the evaluation of patients at risk for COVID-19 are in a state of rapid change based on information released by regulatory bodies including the CDC and federal and state organizations. These policies and algorithms were followed during the patient's care in the ED.  As part of my medical decision making, I reviewed the following data within the [place] notes reviewed and incorporated, Labs reviewed, notes from prior ED visits and [place] Controlled Substance Database   ____________________________________________   FINAL CLINICAL IMPRESSION(S) / ED DIAGNOSES  Final diagnoses:  Accidental drug overdose, initial encounter      NEW MEDICATIONS STARTED DURING THIS VISIT:  New Prescriptions   No medications on file     Note:  This document was prepared using Dragon voice recognition software and may include unintentional dictation errors.    Merlyn Lot, MD 06/09/19 1114

## 2019-06-09 NOTE — ED Notes (Signed)
Forensic blood draw completed by this Therapist, sports. Pt gave verbal consent for this RN to draw blood. Clinical cytogeneticist provided by [place] [employer]. Iodine used to prep site.
